# Patient Record
Sex: Male | Born: 2000 | Hispanic: No | Marital: Single | Smoking: Never smoker
Health system: Southern US, Community
[De-identification: ages and names within clinical notes are randomized; demographics above are authoritative.]

## PROBLEM LIST (undated history)

## (undated) DIAGNOSIS — S62309A Unspecified fracture of unspecified metacarpal bone, initial encounter for closed fracture: Secondary | ICD-10-CM

## (undated) DIAGNOSIS — R1310 Dysphagia, unspecified: Secondary | ICD-10-CM

## (undated) DIAGNOSIS — R198 Other specified symptoms and signs involving the digestive system and abdomen: Secondary | ICD-10-CM

---
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
## 2019-01-11 ENCOUNTER — Emergency Department (HOSPITAL_COMMUNITY)
Admission: EM | Admit: 2019-01-11 | Discharge: 2019-01-11 | Disposition: A | Discharge status: Home / Self Care | Payer: Medicaid Other | Attending: Emergency Medicine | Admitting: Emergency Medicine

## 2019-01-11 ENCOUNTER — Encounter (HOSPITAL_COMMUNITY): Payer: Self-pay

## 2019-01-11 ENCOUNTER — Other Ambulatory Visit: Payer: Self-pay

## 2019-01-11 DIAGNOSIS — N481 Balanitis: Secondary | ICD-10-CM | POA: Diagnosis not present

## 2019-01-11 DIAGNOSIS — N4889 Other specified disorders of penis: Secondary | ICD-10-CM | POA: Diagnosis present

## 2019-01-11 MED ORDER — FLUCONAZOLE 200 MG PO TABS
200.0000 mg | ORAL_TABLET | Freq: Once | ORAL | Status: AC
  Administered 2019-01-11: 14:00:00 200 mg via ORAL
  Filled 2019-01-11: qty 1

## 2019-01-11 MED ORDER — CEPHALEXIN 500 MG PO CAPS: 500.0000 mg | ORAL_CAPSULE | Freq: Four times a day (QID) | ORAL | 0 refills | Status: DC

## 2019-01-11 NOTE — ED Triage Notes (Addendum)
Patient c/o pain at tip of penis X1 week. Patient states he thought he had a yeast infection and got something from [facility] for yeast infection with no relief.   C/O pain and itching.  Denies any urinary symptoms and burning.   Reports having unprotected sex.    A/Ox4 Ambulatory in triage.

## 2019-01-11 NOTE — ED Provider Notes (Signed)
[facility]-EMERGENCY DEPT Provider Note   CSN: [id number] Arrival date & time: 01/11/19  1248    History   Chief Complaint Chief Complaint  Patient presents with  . Penis Pain    HPI Anthony Phelps is a 18 y.o. male.     Patient c/o irritation/itching/swelling to head of penis in past couple weeks. Patient uncircumcised, hx balanitis. Patient denies penile discharge. No dysuria or frequency. No history diabetes. No known std exposure. Tried otc cream without resolution. Denies fever or chills. Does not feel sick or ill. No scrotal or testicular pain or swelling.   The history is provided by the patient.  Penis Pain     Past Medical History:  Diagnosis Date  . Difficulty swallowing pills   . Metacarpal bone fracture 03/08/2014   right 4th/5th    Patient Active Problem List   Diagnosis Date Noted  . 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 OPEN REDUCTION INTERNAL FIXATION (ORIF) METACARPAL Right 03/16/2014   Procedure: CLOSED VS OPEN TREATMENT OF RIGHT 4TH AND 5TH METACARPAL FRACTURES;  Surgeon: Jodi Marble, MD;  Location: [facility];  Service: Orthopedics;  Laterality: Right;  Right 4th and 5th.        Home Medications    Prior to Admission medications   Medication Sig Start Date End Date Taking? Authorizing Provider  HYDROcodone-acetaminophen (NORCO) 5-325 MG per tablet Take 1 tablet by mouth every 6 (six) hours as needed for moderate pain or severe pain. 03/08/14   Mack Hook, MD  nystatin (MYCOSTATIN/NYSTOP) powder Apply topically 2 (two) times daily. 10/16/17   Caccavale, Sophia, PA-C    Family History Family History  Problem Relation Age of Onset  . Diabetes Maternal Grandmother   . Hypertension Maternal Grandmother   . Asthma Maternal Grandmother     Social History Social History   Tobacco Use  . Smoking status: Never Smoker  . Smokeless tobacco: Never Used   Substance Use Topics  . Alcohol use: No  . Drug use: No     Allergies   Patient has no known allergies.   Review of Systems Review of Systems  Constitutional: Negative for fever.  HENT: Negative for sore throat.   Eyes: Negative for redness.  Gastrointestinal: Negative for vomiting.  Endocrine: Negative for polydipsia and polyuria.  Genitourinary: Positive for penile pain. Negative for discharge, scrotal swelling and testicular pain.     Physical Exam Updated Vital Signs BP (!) 143/84 (BP Location: Left Arm)   Pulse 82   Temp 98.5 F (36.9 C) (Oral)   Resp 18   SpO2 100%   Physical Exam Vitals signs and nursing note reviewed.  Constitutional:      Appearance: Normal appearance. He is well-developed.  HENT:     Head: Atraumatic.     Nose: Nose normal.     Mouth/Throat:     Mouth: Mucous membranes are moist.     Pharynx: Oropharynx is clear.  Eyes:     General: No scleral icterus.    Conjunctiva/sclera: Conjunctivae normal.  Neck:     Musculoskeletal: Normal range of motion and neck supple.     Trachea: No tracheal deviation.  Cardiovascular:     Rate and Rhythm: Normal rate.  Pulmonary:     Effort: Pulmonary effort is normal. No accessory muscle usage or respiratory distress.  Abdominal:     General: There is no distension.  Palpations: Abdomen is soft.     Tenderness: There is no abdominal tenderness.  Genitourinary:    Comments: Uncircumcised penis. Pt able to easily retract and reduce foreskin. Excoriation/inflammation about head of penis, fold of foreskin, mild whitish exudate. No penile discharge. No vesicular lesions. No ulcers.  No scrotal or testicular pain, swelling, or tenderness. No ing l/a. Musculoskeletal:        General: No swelling.  Skin:    General: Skin is warm and dry.     Findings: No rash.  Neurological:     Mental Status: He is alert.     Comments: Alert, speech clear.   Psychiatric:        Mood and Affect: Mood normal.       ED Treatments / Results  Labs (all labs ordered are listed, but only abnormal results are displayed) Labs Reviewed - No data to display  EKG None  Radiology No results found.  Procedures Procedures (including critical care time)  Medications Ordered in ED Medications  fluconazole (DIFLUCAN) tablet 200 mg (has no administration in time range)     Initial Impression / Assessment and Plan / ED Course  I have reviewed the triage vital signs and the nursing notes.  Pertinent labs & imaging results that were available during my care of the patient were reviewed by me and considered in my medical decision making (see chart for details).  Symptoms appear c/w fungal balanitis, ?superimposed mild skin infxn.   Confirmed nkda.   Fluconazole po. rx keflex. Discussed keeping area very clean/dry.   rec pcp f/u 1 week.  Return precautions provided.     Final Clinical Impressions(s) / ED Diagnoses   Final diagnoses:  None    ED Discharge Orders    None       Cathren LaineSteinl, Jennamarie Goings, MD 01/11/19 1317

## 2019-01-11 NOTE — Discharge Instructions (Addendum)
It was our pleasure to provide your ER care today - we hope that you feel better.  Keep area very clean and dry.  Wash with warm water/soap 1-2x/day, and make sure to dry well.   Take keflex as prescribed.   You may apply a thin coat of clotrimazole cream and hydrocortisone cream to area as directed - these medications are available over the counter.   Follow up with primary care doctor in 1 week if symptoms fail to improve/resolve.  Return to ER if worse, new symptoms, severe pain, increased swelling/spreading redness, fevers, other concern.

## 2019-06-03 ENCOUNTER — Other Ambulatory Visit: Payer: Self-pay

## 2019-06-03 ENCOUNTER — Emergency Department (HOSPITAL_COMMUNITY)
Admission: EM | Admit: 2019-06-03 | Discharge: 2019-06-03 | Disposition: A | Discharge status: Home / Self Care | Payer: Medicaid Other | Attending: Emergency Medicine | Admitting: Emergency Medicine

## 2019-06-03 ENCOUNTER — Encounter (HOSPITAL_COMMUNITY): Payer: Self-pay | Admitting: Family Medicine

## 2019-06-03 DIAGNOSIS — J111 Influenza due to unidentified influenza virus with other respiratory manifestations: Secondary | ICD-10-CM | POA: Diagnosis not present

## 2019-06-03 DIAGNOSIS — R0981 Nasal congestion: Secondary | ICD-10-CM | POA: Insufficient documentation

## 2019-06-03 DIAGNOSIS — Z20828 Contact with and (suspected) exposure to other viral communicable diseases: Secondary | ICD-10-CM | POA: Diagnosis not present

## 2019-06-03 DIAGNOSIS — Z20822 Contact with and (suspected) exposure to covid-19: Secondary | ICD-10-CM

## 2019-06-03 DIAGNOSIS — R5381 Other malaise: Secondary | ICD-10-CM | POA: Diagnosis present

## 2019-06-03 DIAGNOSIS — R05 Cough: Secondary | ICD-10-CM | POA: Insufficient documentation

## 2019-06-03 LAB — SARS CORONAVIRUS 2 (TAT 6-24 HRS): SARS Coronavirus 2: NEGATIVE

## 2019-06-03 LAB — GROUP A STREP BY PCR: Group A Strep by PCR: NOT DETECTED

## 2019-06-03 MED ORDER — ACETAMINOPHEN 325 MG PO TABS
650.0000 mg | ORAL_TABLET | Freq: Once | ORAL | Status: AC | PRN
  Administered 2019-06-03: 650 mg via ORAL
  Filled 2019-06-03: qty 2

## 2019-06-03 NOTE — ED Notes (Signed)
EDP at bedside  

## 2019-06-03 NOTE — ED Notes (Signed)
Pt ambulatory from Waiting Room to Room 9 with steady gait, no SHOB.

## 2019-06-03 NOTE — Discharge Instructions (Signed)
You may take over-the-counter medicine for symptomatic relief, such as Tylenol, Motrin, TheraFlu, Alka seltzer , black elderberry, etc. Please limit acetaminophen (Tylenol) to 4000 mg and Ibuprofen (Motrin, Advil, etc.) to 2400 mg for a 24hr period. Please note that other over-the-counter medicine may contain acetaminophen or ibuprofen as a component of their ingredients.    For your constipation, you may take MiraLax for assistance.

## 2019-06-03 NOTE — ED Provider Notes (Signed)
Anthony Phelps, Anthony Phelps, Anthony Phelps  HPI Anthony Phelps is a 18 y.o. male who presents to the emergency department with 1 week of generalized malaise Anthony myalgias with intermittent Phelps, Anthony Phelps 1 day of fever.  Alleviated by Tylenol.  No chest pain or shortness of breath.  No nausea or vomiting.  No abdominal pain.  No urinary symptoms.  Patient denies any known sick contacts or COVID exposure.  Reports that he was tested Thursday with a saliva COVID tes which resulted negative yesterday.  HPI  Past Medical History Past Medical History:  Diagnosis Date   Difficulty swallowing pills    Metacarpal bone fracture 03/08/2014   right 4th/5th   Patient Active Problem List   Diagnosis Date Noted   Bone infection ([place]) 05/17/2014   Home Medication(s) Prior to Admission medications   Medication Sig Start Date End Date Taking? Authorizing Provider  cephALEXin (KEFLEX) 500 MG capsule Take 1 capsule (500 mg total) by mouth 4 (four) times daily. 01/11/19   Lajean Saver, MD  HYDROcodone-acetaminophen (NORCO) 5-325 MG per tablet Take 1 tablet by mouth every 6 (six) hours as needed for moderate pain or severe pain. 03/08/14   Milly Jakob, MD  nystatin (MYCOSTATIN/NYSTOP) powder Apply topically 2 (two) times daily. 10/16/17   Franchot Heidelberg, PA-C                                                                                                                                    Past Surgical History Past Surgical History:  Procedure Laterality Date   OPEN REDUCTION INTERNAL FIXATION (ORIF) METACARPAL Right 03/16/2014   Procedure: CLOSED VS OPEN TREATMENT OF RIGHT 4TH Anthony 5TH METACARPAL FRACTURES;  Surgeon: Jolyn Nap, MD;  Location: [facility];  Service: Orthopedics;  Laterality:  Right;  Right 4th Anthony 5th.   Family History Family History  Problem Relation Age of Onset   Diabetes Maternal Grandmother    Hypertension Maternal Grandmother    Asthma Maternal Grandmother     Social History Social History   Tobacco Use   Smoking status: Never Smoker   Smokeless tobacco: Never Used  Substance Use Topics   Alcohol use: No   Drug use: No   Allergies Patient has no known allergies.  Review of Systems Review of Systems All other systems are reviewed Anthony are negative for acute change except as noted in the HPI  Physical Exam Vital Signs  I have reviewed the triage vital signs BP (!) 127/58    Pulse 82    Temp 97.7 F (36.5 C) (Oral)    Resp 16    Ht 5' 5.5" (1.664 m)    Wt 81.6 kg    SpO2 100%    BMI 29.50 kg/m  Physical Exam Vitals signs reviewed.  Constitutional:      General: He is not in acute distress.    Appearance: He is well-developed. He is not diaphoretic.  HENT:     Head: Normocephalic Anthony atraumatic.     Nose: Nose normal.     Mouth/Phelps:     Pharynx: Oropharynx is clear.  Eyes:     General: No scleral icterus.       Right eye: No discharge.        Left eye: No discharge.     Conjunctiva/sclera: Conjunctivae normal.     Pupils: Pupils are equal, round, Anthony reactive to light.  Neck:     Musculoskeletal: Normal range of motion Anthony neck supple.  Cardiovascular:     Rate Anthony Rhythm: Normal rate Anthony regular rhythm.     Heart sounds: No murmur. No friction rub. No gallop.   Pulmonary:     Effort: Pulmonary effort is normal. No respiratory distress.     Breath sounds: Normal breath sounds. No stridor. No wheezing, rhonchi or rales.  Abdominal:     General: There is no distension.     Palpations: Abdomen is soft.     Tenderness: There is no abdominal tenderness.  Musculoskeletal:        General: No tenderness.  Skin:    General: Skin is warm Anthony dry.     Findings: No erythema or rash.  Neurological:     Mental Status: He  is alert Anthony oriented to person, place, Anthony time.     ED Results Anthony Treatments Labs (all labs ordered are listed, but only abnormal results are displayed) Labs Reviewed  GROUP A STREP BY PCR  SARS CORONAVIRUS 2 (TAT 6-24 HRS)                                                                                                                         EKG  EKG Interpretation  Date/Time:    Ventricular Rate:    PR Interval:    QRS Duration:   QT Interval:    QTC Calculation:   R Axis:     Text Interpretation:        Radiology No results found.  Pertinent labs & imaging results that were available during my care of the patient were reviewed by me Anthony considered in my medical decision making (see chart for details).  Medications Ordered in ED Medications  acetaminophen (TYLENOL) tablet 650 mg (650 mg Oral Given 06/03/19 0204)  Procedures Procedures  (including critical care time)  Medical Decision Making / ED Course I have reviewed the nursing notes for this encounter Anthony the patient's prior records (if available in EHR or on provided paperwork).   HEMI CHACKO was evaluated in Emergency Department on 06/03/2019 for the symptoms described in the history of present illness. He was evaluated in the context of the global COVID-19 pandemic, which necessitated consideration that the patient might be at risk for infection with the SARS-CoV-2 virus that causes COVID-19. Institutional protocols Anthony algorithms that pertain to the evaluation of patients at risk for COVID-19 are in a state of rapid change based on information released by regulatory bodies including the CDC Anthony federal Anthony state organizations. These policies Anthony algorithms were followed during the patient's care in the ED.  Patient presents with viral symptoms for 1 week. adequate oral hydration. Rest  of history as above.  Patient appears well. No signs of toxicity, patient is interactive. No hypoxia, tachypnea or other signs of respiratory distress. No sign of clinical dehydration. Lung exam clear. Rest of exam as above.  Most consistent with viral illness   No evidence suggestive of pharyngitis, AOM, PNA, or meningitis.  Chest x-ray not indicated at this time.  Patient evaluated, tested Anthony sent home with instructions for home care Anthony Quarantine. Instructed to seek further care if symptoms worsen.  Is set up with follow up for a virtual visit with PCP in next 24-48 hours.  Discussed symptomatic treatment with the patient Anthony they will follow closely with their PCP.        Final Clinical Impression(s) / ED Diagnoses Final diagnoses:  Influenza-like illness  Suspected COVID-19 virus infection    The patient appears reasonably screened Anthony/or stabilized for discharge Anthony I doubt any other medical condition or other [facility] requiring further screening, evaluation, or treatment in the ED at this time prior to discharge.  Disposition: Discharge  Condition: Good  I have discussed the results, Dx Anthony Tx plan with the patient who expressed understanding Anthony agree(s) with the plan. Discharge instructions discussed at great length. The patient was given strict return precautions who verbalized understanding of the instructions. No further questions at time of discharge.    ED Discharge Orders    None       Follow Up: Primary care provider  Schedule an appointment as soon as possible for a visit  If you do not have a primary care physician, contact [facility] at [phone] for referral     This chart was dictated using voice recognition software.  Despite best efforts to proofread,  errors can occur which can change the documentation meaning.   Nira Conn, MD 06/03/19 [phone]

## 2019-06-03 NOTE — ED Triage Notes (Signed)
Patient states he is experiencing body aches, non productive cough, sore throat for the last two weeks. Patient states he was tested for COVID four days ago and informed yesterday he was negative for symptoms.

## 2019-06-04 ENCOUNTER — Encounter (HOSPITAL_COMMUNITY): Payer: Self-pay

## 2019-06-04 ENCOUNTER — Emergency Department (HOSPITAL_COMMUNITY)
Admission: EM | Admit: 2019-06-04 | Discharge: 2019-06-04 | Disposition: A | Discharge status: Home / Self Care | Payer: Medicaid Other | Attending: Emergency Medicine | Admitting: Emergency Medicine

## 2019-06-04 DIAGNOSIS — L509 Urticaria, unspecified: Secondary | ICD-10-CM | POA: Diagnosis not present

## 2019-06-04 DIAGNOSIS — R21 Rash and other nonspecific skin eruption: Secondary | ICD-10-CM | POA: Diagnosis present

## 2019-06-04 MED ORDER — FAMOTIDINE 40 MG/5ML PO SUSR
20.0000 mg | Freq: Once | ORAL | Status: AC
  Administered 2019-06-04: 20 mg via ORAL
  Filled 2019-06-04: qty 2.5

## 2019-06-04 MED ORDER — DIPHENHYDRAMINE HCL 12.5 MG/5ML PO ELIX
50.0000 mg | ORAL_SOLUTION | Freq: Once | ORAL | Status: AC
  Administered 2019-06-04: 04:00:00 50 mg via ORAL
  Filled 2019-06-04: qty 20

## 2019-06-04 MED ORDER — DEXAMETHASONE SODIUM PHOSPHATE 10 MG/ML IJ SOLN
10.0000 mg | Freq: Once | INTRAMUSCULAR | Status: AC
  Administered 2019-06-04: 10 mg via INTRAMUSCULAR
  Filled 2019-06-04: qty 1

## 2019-06-04 NOTE — ED Provider Notes (Signed)
[facility] DEPT Provider Note   CSN: [id number] Arrival date & time: 06/04/19  0129     History   Chief Complaint No chief complaint on file.   HPI Anthony Phelps is a 18 y.o. male.     Patient presents to the emergency department with rash.  Patient reports that he broke out with an itchy rash on his right arm 3 or 4 hours ago.  The rash on the arm is fading but he now has rash on both of his legs, left more than right.  Patient reports that he has not been outside because he is currently quarantining for a febrile illness.  He has not been on any new medications, other than Tylenol.  No history of allergy to medications.  He has not used any new skin products.  He is not experiencing any tongue swelling, throat swelling, difficulty breathing.  No nausea or abdominal pain.     Past Medical History:  Diagnosis Date  . Difficulty swallowing pills   . Metacarpal bone fracture 03/08/2014   right 4th/5th    Patient Active Problem List   Diagnosis Date Noted  . Bone infection ([place]) 05/17/2014    Past Surgical History:  Procedure Laterality Date  . OPEN REDUCTION INTERNAL FIXATION (ORIF) METACARPAL Right 03/16/2014   Procedure: CLOSED VS OPEN TREATMENT OF RIGHT 4TH AND 5TH METACARPAL FRACTURES;  Surgeon: Jolyn Nap, MD;  Location: [facility];  Service: Orthopedics;  Laterality: Right;  Right 4th and 5th.        Home Medications    Prior to Admission medications   Medication Sig Start Date End Date Taking? Authorizing Provider  cephALEXin (KEFLEX) 500 MG capsule Take 1 capsule (500 mg total) by mouth 4 (four) times daily. 01/11/19   Lajean Saver, MD  HYDROcodone-acetaminophen (NORCO) 5-325 MG per tablet Take 1 tablet by mouth every 6 (six) hours as needed for moderate pain or severe pain. 03/08/14   Milly Jakob, MD  nystatin (MYCOSTATIN/NYSTOP) powder Apply topically 2 (two) times daily. 10/16/17   Caccavale, Sophia, PA-C    Family History Family History  Problem Relation Age of Onset  . Diabetes Maternal Grandmother   . Hypertension Maternal Grandmother   . Asthma Maternal Grandmother     Social History Social History   Tobacco Use  . Smoking status: Never Smoker  . Smokeless tobacco: Never Used  Substance Use Topics  . Alcohol use: No  . Drug use: No     Allergies   Patient has no known allergies.   Review of Systems Review of Systems  Skin: Positive for rash.  All other systems reviewed and are negative.    Physical Exam Updated Vital Signs BP 129/78 (BP Location: Right Arm)   Pulse 84   Temp 98.7 F (37.1 C) (Oral)   Resp 20   SpO2 99%   Physical Exam Vitals signs and nursing note reviewed.  Constitutional:      General: He is not in acute distress.    Appearance: Normal appearance. He is well-developed.  HENT:     Head: Normocephalic and atraumatic.     Right Ear: Hearing normal.     Left Ear: Hearing normal.     Nose: Nose normal.  Eyes:     Conjunctiva/sclera: Conjunctivae normal.     Pupils: Pupils are equal, round, and reactive to light.  Neck:     Musculoskeletal: Normal range of motion and neck supple.  Cardiovascular:  Rate and Rhythm: Regular rhythm.     Heart sounds: S1 normal and S2 normal. No murmur. No friction rub. No gallop.   Pulmonary:     Effort: Pulmonary effort is normal. No respiratory distress.     Breath sounds: Normal breath sounds.  Chest:     Chest wall: No tenderness.  Abdominal:     General: Bowel sounds are normal.     Palpations: Abdomen is soft.     Tenderness: There is no abdominal tenderness. There is no guarding or rebound. Negative signs include Murphy's sign and McBurney's sign.     Hernia: No hernia is present.  Musculoskeletal: Normal range of motion.  Skin:    General: Skin is warm and dry.     Findings: Rash (urticaria on bilateral lower legs) present.  Neurological:     Mental Status: He is alert and oriented to  person, place, and time.     GCS: GCS eye subscore is 4. GCS verbal subscore is 5. GCS motor subscore is 6.     Cranial Nerves: No cranial nerve deficit.     Sensory: No sensory deficit.     Coordination: Coordination normal.  Psychiatric:        Speech: Speech normal.        Behavior: Behavior normal.        Thought Content: Thought content normal.      ED Treatments / Results  Labs (all labs ordered are listed, but only abnormal results are displayed) Labs Reviewed - No data to display  EKG None  Radiology No results found.  Procedures Procedures (including critical care time)  Medications Ordered in ED Medications  dexamethasone (DECADRON) injection 10 mg (has no administration in time range)  diphenhydrAMINE (BENADRYL) 12.5 MG/5ML elixir 50 mg (50 mg Oral Given 06/04/19 0417)  famotidine (PEPCID) 40 MG/5ML suspension 20 mg (20 mg Oral Given 06/04/19 0416)     Initial Impression / Assessment and Plan / ED Course  I have reviewed the triage vital signs and the nursing notes.  Pertinent labs & imaging results that were available during my care of the patient were reviewed by me and considered in my medical decision making (see chart for details).        Patient presents to the emergency department for evaluation of urticaria.  Patient broke out with hives on his arm and then started to have hives on both legs, came to the ER for evaluation.  He has not taken any medication prior to arrival.  He is not in any distress, no signs of anaphylaxis.  Vital signs are all normal.  Because of the hives is unclear.  He has been ill for approximately a week with febrile illness.  COVID-19 test performed yesterday has resulted and is negative.  Patient treated with Benadryl and Pepcid, monitored.  His hives have now resolved.  We will give IM injection of Decadron and have him continue p.o. Benadryl as needed as an outpatient. Final Clinical Impressions(s) / ED Diagnoses   Final  diagnoses:  Hives    ED Discharge Orders    None       Gilda Crease, MD 06/04/19 [phone]

## 2019-06-04 NOTE — Discharge Instructions (Signed)
Take Benadryl (over-the-counter medication) 1 tablet every 6 hours for the next 2 days.

## 2019-06-04 NOTE — ED Triage Notes (Signed)
Pt was here last night for the same and wasn't seen, he complains of body aches, fever, and a cough for two weeks, has tested negative for COVID Pt states that tonight he developed a generalized rash

## 2020-02-25 DIAGNOSIS — Z419 Encounter for procedure for purposes other than remedying health state, unspecified: Secondary | ICD-10-CM | POA: Diagnosis not present

## 2020-03-27 DIAGNOSIS — Z419 Encounter for procedure for purposes other than remedying health state, unspecified: Secondary | ICD-10-CM | POA: Diagnosis not present

## 2020-04-27 DIAGNOSIS — Z419 Encounter for procedure for purposes other than remedying health state, unspecified: Secondary | ICD-10-CM | POA: Diagnosis not present

## 2020-05-27 DIAGNOSIS — Z419 Encounter for procedure for purposes other than remedying health state, unspecified: Secondary | ICD-10-CM | POA: Diagnosis not present

## 2020-06-15 DIAGNOSIS — Z0000 Encounter for general adult medical examination without abnormal findings: Secondary | ICD-10-CM | POA: Diagnosis not present

## 2020-06-15 DIAGNOSIS — F419 Anxiety disorder, unspecified: Secondary | ICD-10-CM | POA: Diagnosis not present

## 2020-06-27 DIAGNOSIS — Z419 Encounter for procedure for purposes other than remedying health state, unspecified: Secondary | ICD-10-CM | POA: Diagnosis not present

## 2020-07-27 DIAGNOSIS — Z419 Encounter for procedure for purposes other than remedying health state, unspecified: Secondary | ICD-10-CM | POA: Diagnosis not present

## 2020-08-27 DIAGNOSIS — Z419 Encounter for procedure for purposes other than remedying health state, unspecified: Secondary | ICD-10-CM | POA: Diagnosis not present

## 2020-09-27 DIAGNOSIS — Z419 Encounter for procedure for purposes other than remedying health state, unspecified: Secondary | ICD-10-CM | POA: Diagnosis not present

## 2020-10-25 DIAGNOSIS — Z419 Encounter for procedure for purposes other than remedying health state, unspecified: Secondary | ICD-10-CM | POA: Diagnosis not present

## 2020-11-25 DIAGNOSIS — Z419 Encounter for procedure for purposes other than remedying health state, unspecified: Secondary | ICD-10-CM | POA: Diagnosis not present

## 2020-12-25 DIAGNOSIS — Z419 Encounter for procedure for purposes other than remedying health state, unspecified: Secondary | ICD-10-CM | POA: Diagnosis not present

## 2021-01-01 ENCOUNTER — Other Ambulatory Visit: Payer: Self-pay

## 2021-01-01 ENCOUNTER — Emergency Department (HOSPITAL_COMMUNITY)
Admission: EM | Admit: 2021-01-01 | Discharge: 2021-01-01 | Disposition: A | Discharge status: Home / Self Care | Payer: Managed Care, Other (non HMO) | Attending: Emergency Medicine | Admitting: Emergency Medicine

## 2021-01-01 DIAGNOSIS — R300 Dysuria: Secondary | ICD-10-CM | POA: Insufficient documentation

## 2021-01-01 DIAGNOSIS — R110 Nausea: Secondary | ICD-10-CM | POA: Insufficient documentation

## 2021-01-01 DIAGNOSIS — B349 Viral infection, unspecified: Secondary | ICD-10-CM | POA: Diagnosis not present

## 2021-01-01 LAB — URINALYSIS, ROUTINE W REFLEX MICROSCOPIC
Bilirubin Urine: NEGATIVE
Glucose, UA: NEGATIVE mg/dL
Hgb urine dipstick: NEGATIVE
Ketones, ur: NEGATIVE mg/dL
Leukocytes,Ua: NEGATIVE
Nitrite: NEGATIVE
Protein, ur: NEGATIVE mg/dL
Specific Gravity, Urine: 1.004 — ABNORMAL LOW (ref 1.005–1.030)
pH: 6 (ref 5.0–8.0)

## 2021-01-01 LAB — COMPREHENSIVE METABOLIC PANEL
ALT: 21 U/L (ref 0–44)
AST: 22 U/L (ref 15–41)
Albumin: 4.4 g/dL (ref 3.5–5.0)
Alkaline Phosphatase: 65 U/L (ref 38–126)
Anion gap: 4 — ABNORMAL LOW (ref 5–15)
BUN: 13 mg/dL (ref 6–20)
CO2: 29 mmol/L (ref 22–32)
Calcium: 9.3 mg/dL (ref 8.9–10.3)
Chloride: 105 mmol/L (ref 98–111)
Creatinine, Ser: 0.98 mg/dL (ref 0.61–1.24)
GFR, Estimated: >60 mL/min (ref >60)
Glucose, Bld: 94 mg/dL (ref 70–99)
Potassium: 4.3 mmol/L (ref 3.5–5.1)
Sodium: 138 mmol/L (ref 135–145)
Total Bilirubin: 0.5 mg/dL (ref 0.3–1.2)
Total Protein: 7.8 g/dL (ref 6.5–8.1)

## 2021-01-01 LAB — CBC
HCT: 44.2 % (ref 39.0–52.0)
Hemoglobin: 15.8 g/dL (ref 13.0–17.0)
MCH: 31.6 pg (ref 26.0–34.0)
MCHC: 35.7 g/dL (ref 30.0–36.0)
MCV: 88.4 fL (ref 80.0–100.0)
Platelets: 176 10*3/uL (ref 150–400)
RBC: 5 MIL/uL (ref 4.22–5.81)
RDW: 11.8 % (ref 11.5–15.5)
WBC: 7.6 10*3/uL (ref 4.0–10.5)
nRBC: 0 % (ref 0.0–0.2)

## 2021-01-01 LAB — CBG MONITORING, ED: Glucose-Capillary: 84 mg/dL (ref 70–99)

## 2021-01-01 LAB — LIPASE, BLOOD: Lipase: 40 U/L (ref 11–51)

## 2021-01-01 MED ORDER — SODIUM CHLORIDE 0.9 % IV BOLUS
1000.0000 mL | Freq: Once | INTRAVENOUS | Status: AC
  Administered 2021-01-01: 1000 mL via INTRAVENOUS

## 2021-01-01 MED ORDER — ONDANSETRON HCL 4 MG/2ML IJ SOLN
4.0000 mg | Freq: Once | INTRAMUSCULAR | Status: AC
  Administered 2021-01-01: 4 mg via INTRAVENOUS
  Filled 2021-01-01: qty 2

## 2021-01-01 NOTE — ED Triage Notes (Signed)
Patient reports nausea/diarrhea/dizziness since Friday. Says he feels like he could pass out at any moment. endorses abd pain 4/10.

## 2021-01-01 NOTE — ED Provider Notes (Signed)
[facility]-EMERGENCY DEPT Provider Note   CSN: [id number] Arrival date & time: 01/01/21  1032     History Chief Complaint  Patient presents with  . Nausea  . Dysuria    Anthony Phelps is a 20 y.o. male presents to ED with 2-day history of nausea, slight diarrhea, lightheadedness, dysuria and urinary frequency.  He cannot recall any incident that may have triggered the symptoms.  No sick contacts with similar symptoms.  Has tried Dramamine and DayQuil with only minimal improvement in symptoms.  Reports normal appetite level, denies any vomiting.  No alcohol or drug use.  No prior abdominal surgeries.  Denies any chest pain, shortness of breath, fever, back pain, injuries or falls.  No vision changes.  HPI     Past Medical History:  Diagnosis Date  . Difficulty swallowing pills   . Metacarpal bone fracture 03/08/2014   right 4th/5th    Patient Active Problem List   Diagnosis Date Noted  . 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 OPEN REDUCTION INTERNAL FIXATION (ORIF) METACARPAL Right 03/16/2014   Procedure: CLOSED VS OPEN TREATMENT OF RIGHT 4TH AND 5TH METACARPAL FRACTURES;  Surgeon: Jodi Marble, MD;  Location: [facility];  Service: Orthopedics;  Laterality: Right;  Right 4th and 5th.       Family History  Problem Relation Age of Onset  . Diabetes Maternal Grandmother   . Hypertension Maternal Grandmother   . Asthma Maternal Grandmother     Social History   Tobacco Use  . Smoking status: Never Smoker  . Smokeless tobacco: Never Used  Substance Use Topics  . Alcohol use: No  . Drug use: No    Home Medications Prior to Admission medications   Medication Sig Start Date End Date Taking? Authorizing Provider  cephALEXin (KEFLEX) 500 MG capsule Take 1 capsule (500 mg total) by mouth 4 (four) times daily. 01/11/19   Cathren Laine, MD  HYDROcodone-acetaminophen (NORCO) 5-325 MG per  tablet Take 1 tablet by mouth every 6 (six) hours as needed for moderate pain or severe pain. 03/08/14   Mack Hook, MD  nystatin (MYCOSTATIN/NYSTOP) powder Apply topically 2 (two) times daily. 10/16/17   Caccavale, Sophia, PA-C    Allergies    Patient has no known allergies.  Review of Systems   Review of Systems  Constitutional: Positive for fatigue. Negative for appetite change, chills and fever.  HENT: Negative for ear pain, rhinorrhea, sneezing and sore throat.   Eyes: Negative for photophobia and visual disturbance.  Respiratory: Negative for cough, chest tightness, shortness of breath and wheezing.   Cardiovascular: Negative for chest pain and palpitations.  Gastrointestinal: Positive for diarrhea and nausea. Negative for abdominal pain, blood in stool, constipation and vomiting.  Genitourinary: Positive for dysuria and frequency. Negative for hematuria and urgency.  Musculoskeletal: Negative for myalgias.  Skin: Negative for rash.  Neurological: Positive for light-headedness. Negative for dizziness and weakness.    Physical Exam Updated Vital Signs BP 112/67   Pulse (!) 51   Temp 98.2 F (36.8 C) (Oral)   Resp 16   Ht 5\' 5"  (1.651 m)   Wt 74.8 kg   SpO2 100%   BMI 27.46 kg/m   Physical Exam Vitals and nursing note reviewed.  Constitutional:      General: He is not in acute distress.    Appearance: He is well-developed.  HENT:     Head: Normocephalic and  atraumatic.     Nose: Nose normal.  Eyes:     General: No scleral icterus.       Right eye: No discharge.        Left eye: No discharge.     Conjunctiva/sclera: Conjunctivae normal.     Pupils: Pupils are equal, round, and reactive to light.  Cardiovascular:     Rate and Rhythm: Normal rate and regular rhythm.     Heart sounds: Normal heart sounds. No murmur heard. No friction rub. No gallop.   Pulmonary:     Effort: Pulmonary effort is normal. No respiratory distress.     Breath sounds: Normal breath  sounds.  Abdominal:     General: Bowel sounds are normal. There is no distension.     Palpations: Abdomen is soft.     Tenderness: There is no abdominal tenderness. There is no guarding.  Musculoskeletal:        General: Normal range of motion.     Cervical back: Normal range of motion and neck supple.  Skin:    General: Skin is warm and dry.     Findings: No rash.  Neurological:     Mental Status: He is alert and oriented to person, place, and time.     Cranial Nerves: No cranial nerve deficit.     Sensory: No sensory deficit.     Motor: No weakness or abnormal muscle tone.     Coordination: Coordination normal.     ED Results / Procedures / Treatments   Labs (all labs ordered are listed, but only abnormal results are displayed) Labs Reviewed  COMPREHENSIVE METABOLIC PANEL - Abnormal; Notable for the following components:      Result Value   Anion gap 4 (*)    All other components within normal limits  URINALYSIS, ROUTINE W REFLEX MICROSCOPIC - Abnormal; Notable for the following components:   Color, Urine COLORLESS (*)    Specific Gravity, Urine 1.004 (*)    All other components within normal limits  URINE CULTURE  LIPASE, BLOOD  CBC  CBG MONITORING, ED    EKG EKG Interpretation  Date/Time:  Sunday Jan 01 2021 11:01:41 EDT Ventricular Rate:  66 PR Interval:  164 QRS Duration: 80 QT Interval:  361 QTC Calculation: 379 R Axis:   96 Text Interpretation: Sinus rhythm Borderline right axis deviation No previous tracing Confirmed by Knapp, Jon ([id number]) on 01/01/2021 11:12:01 AM   Radiology No results found.  Procedures Procedures   Medications Ordered in ED Medications  sodium chloride 0.9 % bolus 1,000 mL (0 mLs Intravenous Stopped 01/01/21 1210)  ondansetron (ZOFRAN) injection 4 mg (4 mg Intravenous Given 01/01/21 1112)    ED Course  I have reviewed the triage vital signs and the nursing notes.  Pertinent labs & imaging results that were available during my  care of the patient were reviewed by me and considered in my medical decision making (see chart for details).  Clinical Course as of 01/01/21 1305  Sun Jan 01, 2021  1122 Glucose-Capillary: 84 [HK]  1141 WBC: 7.6 [HK]  1141 Hemoglobin: 15.8 [HK]  1213 Creatinine: 0.98 [HK]  1213 Lipase: 40 [HK]  1234 Specific Gravity, Urine(!): 1.004 [HK]    Clinical Course User Index [HK] Magaline Steinberg, PA-C   MDM Rules/Calculators/A&P                          20  year old male presenting to the ED for 2-day history of  nausea, dysuria, urinary frequency, diarrhea and lightheadedness.  Abdomen is soft here.  No neurological deficits.  No CVA tenderness noted bilaterally.  Vital signs within normal limits.  Will obtain lab work, attempt to IV hydrate and give Zofran and reassess.  EKG shows sinus rhythm, no arrhythmias or ischemic changes.  Normal CBG.  CBC, CMP unremarkable.  Normal lipase.  Urinalysis without evidence of infection.  Relayed these results to the patient.  Asked if he would like to be tested or is concern for STDs.  He declines.  I suspect his symptoms could be viral in nature.  He is tolerating p.o. intake without difficulty here and he feels much improved.  He is requesting discharge home with a work note.  I doubt his symptoms are due to acute neurological cause such as stroke, or any intra-abdominal surgical cause such as cholecystitis or appendicitis.  Patient is agreeable to the plan.  Return precautions given.   Patient is hemodynamically stable, in NAD, and able to ambulate in the ED. Evaluation does not show pathology that would require ongoing emergent intervention or inpatient treatment. I explained the diagnosis to the patient. Pain has been managed and has no complaints prior to discharge. Patient is comfortable with above plan and is stable for discharge at this time. All questions were answered prior to disposition. Strict return precautions for returning to the ED were discussed.  Encouraged follow up with PCP.   An After Visit Summary was printed and given to the patient.   Portions of this note were generated with Scientist, clinical (histocompatibility and immunogenetics). Dictation errors may occur despite best attempts at proofreading.  Final Clinical Impression(s) / ED Diagnoses Final diagnoses:  Viral syndrome  Nausea    Rx / DC Orders ED Discharge Orders    None       Dietrich Pates, PA-C 01/01/21 1305    Linwood Dibbles, MD 01/01/21 1654

## 2021-01-01 NOTE — ED Notes (Signed)
Patient given water at this time for po challenge, then will ambulate patient

## 2021-01-01 NOTE — Discharge Instructions (Addendum)
Return to the ER if you start to experience worsening lightheadedness, uncontrollable vomiting, chest pain, shortness of breath, numbness in arms or legs or loss of consciousness

## 2021-01-02 LAB — URINE CULTURE: Culture: NO GROWTH

## 2021-01-25 DIAGNOSIS — Z419 Encounter for procedure for purposes other than remedying health state, unspecified: Secondary | ICD-10-CM | POA: Diagnosis not present

## 2021-02-24 DIAGNOSIS — Z419 Encounter for procedure for purposes other than remedying health state, unspecified: Secondary | ICD-10-CM | POA: Diagnosis not present

## 2021-03-27 DIAGNOSIS — Z419 Encounter for procedure for purposes other than remedying health state, unspecified: Secondary | ICD-10-CM | POA: Diagnosis not present

## 2021-04-27 DIAGNOSIS — Z419 Encounter for procedure for purposes other than remedying health state, unspecified: Secondary | ICD-10-CM | POA: Diagnosis not present

## 2021-05-27 DIAGNOSIS — Z419 Encounter for procedure for purposes other than remedying health state, unspecified: Secondary | ICD-10-CM | POA: Diagnosis not present

## 2021-06-27 DIAGNOSIS — Z419 Encounter for procedure for purposes other than remedying health state, unspecified: Secondary | ICD-10-CM | POA: Diagnosis not present

## 2021-07-20 DIAGNOSIS — M79672 Pain in left foot: Secondary | ICD-10-CM | POA: Diagnosis not present

## 2021-07-27 DIAGNOSIS — Z419 Encounter for procedure for purposes other than remedying health state, unspecified: Secondary | ICD-10-CM | POA: Diagnosis not present

## 2021-08-27 DIAGNOSIS — Z419 Encounter for procedure for purposes other than remedying health state, unspecified: Secondary | ICD-10-CM | POA: Diagnosis not present

## 2021-09-27 DIAGNOSIS — Z419 Encounter for procedure for purposes other than remedying health state, unspecified: Secondary | ICD-10-CM | POA: Diagnosis not present

## 2021-10-25 DIAGNOSIS — Z419 Encounter for procedure for purposes other than remedying health state, unspecified: Secondary | ICD-10-CM | POA: Diagnosis not present

## 2021-11-25 DIAGNOSIS — Z419 Encounter for procedure for purposes other than remedying health state, unspecified: Secondary | ICD-10-CM | POA: Diagnosis not present

## 2021-12-25 DIAGNOSIS — Z419 Encounter for procedure for purposes other than remedying health state, unspecified: Secondary | ICD-10-CM | POA: Diagnosis not present

## 2022-01-25 DIAGNOSIS — Z419 Encounter for procedure for purposes other than remedying health state, unspecified: Secondary | ICD-10-CM | POA: Diagnosis not present

## 2022-02-24 DIAGNOSIS — Z419 Encounter for procedure for purposes other than remedying health state, unspecified: Secondary | ICD-10-CM | POA: Diagnosis not present

## 2022-03-27 DIAGNOSIS — Z419 Encounter for procedure for purposes other than remedying health state, unspecified: Secondary | ICD-10-CM | POA: Diagnosis not present

## 2022-04-27 DIAGNOSIS — Z419 Encounter for procedure for purposes other than remedying health state, unspecified: Secondary | ICD-10-CM | POA: Diagnosis not present

## 2022-05-27 DIAGNOSIS — Z419 Encounter for procedure for purposes other than remedying health state, unspecified: Secondary | ICD-10-CM | POA: Diagnosis not present

## 2022-06-27 DIAGNOSIS — Z419 Encounter for procedure for purposes other than remedying health state, unspecified: Secondary | ICD-10-CM | POA: Diagnosis not present

## 2022-07-27 DIAGNOSIS — Z419 Encounter for procedure for purposes other than remedying health state, unspecified: Secondary | ICD-10-CM | POA: Diagnosis not present

## 2022-09-27 DIAGNOSIS — Z419 Encounter for procedure for purposes other than remedying health state, unspecified: Secondary | ICD-10-CM | POA: Diagnosis not present

## 2022-10-26 DIAGNOSIS — Z419 Encounter for procedure for purposes other than remedying health state, unspecified: Secondary | ICD-10-CM | POA: Diagnosis not present

## 2022-11-26 DIAGNOSIS — Z419 Encounter for procedure for purposes other than remedying health state, unspecified: Secondary | ICD-10-CM | POA: Diagnosis not present

## 2022-12-26 DIAGNOSIS — Z419 Encounter for procedure for purposes other than remedying health state, unspecified: Secondary | ICD-10-CM | POA: Diagnosis not present

## 2023-01-26 DIAGNOSIS — Z419 Encounter for procedure for purposes other than remedying health state, unspecified: Secondary | ICD-10-CM | POA: Diagnosis not present

## 2023-02-25 DIAGNOSIS — Z419 Encounter for procedure for purposes other than remedying health state, unspecified: Secondary | ICD-10-CM | POA: Diagnosis not present

## 2023-03-28 DIAGNOSIS — Z419 Encounter for procedure for purposes other than remedying health state, unspecified: Secondary | ICD-10-CM | POA: Diagnosis not present

## 2023-04-28 DIAGNOSIS — Z419 Encounter for procedure for purposes other than remedying health state, unspecified: Secondary | ICD-10-CM | POA: Diagnosis not present

## 2023-05-19 DIAGNOSIS — J069 Acute upper respiratory infection, unspecified: Secondary | ICD-10-CM | POA: Diagnosis not present

## 2023-05-23 DIAGNOSIS — J069 Acute upper respiratory infection, unspecified: Secondary | ICD-10-CM | POA: Diagnosis not present

## 2023-05-23 DIAGNOSIS — Z20822 Contact with and (suspected) exposure to covid-19: Secondary | ICD-10-CM | POA: Diagnosis not present

## 2023-05-28 DIAGNOSIS — Z419 Encounter for procedure for purposes other than remedying health state, unspecified: Secondary | ICD-10-CM | POA: Diagnosis not present

## 2023-06-21 ENCOUNTER — Emergency Department (HOSPITAL_COMMUNITY)
Admission: EM | Admit: 2023-06-21 | Discharge: 2023-06-21 | Disposition: A | Discharge status: Home / Self Care | Payer: Managed Care, Other (non HMO) | Attending: Emergency Medicine | Admitting: Emergency Medicine

## 2023-06-21 ENCOUNTER — Encounter (HOSPITAL_COMMUNITY): Payer: Self-pay

## 2023-06-21 ENCOUNTER — Emergency Department (HOSPITAL_COMMUNITY): Payer: Managed Care, Other (non HMO)

## 2023-06-21 ENCOUNTER — Other Ambulatory Visit: Payer: Self-pay

## 2023-06-21 DIAGNOSIS — W231XXA Caught, crushed, jammed, or pinched between stationary objects, initial encounter: Secondary | ICD-10-CM | POA: Insufficient documentation

## 2023-06-21 DIAGNOSIS — S62637B Displaced fracture of distal phalanx of left little finger, initial encounter for open fracture: Secondary | ICD-10-CM | POA: Insufficient documentation

## 2023-06-21 DIAGNOSIS — S62639B Displaced fracture of distal phalanx of unspecified finger, initial encounter for open fracture: Secondary | ICD-10-CM

## 2023-06-21 DIAGNOSIS — Z23 Encounter for immunization: Secondary | ICD-10-CM | POA: Diagnosis not present

## 2023-06-21 DIAGNOSIS — S6992XA Unspecified injury of left wrist, hand and finger(s), initial encounter: Secondary | ICD-10-CM | POA: Diagnosis present

## 2023-06-21 DIAGNOSIS — S6710XA Crushing injury of unspecified finger(s), initial encounter: Secondary | ICD-10-CM

## 2023-06-21 DIAGNOSIS — S61319A Laceration without foreign body of unspecified finger with damage to nail, initial encounter: Secondary | ICD-10-CM

## 2023-06-21 MED ORDER — LIDOCAINE HCL (PF) 1 % IJ SOLN
10.0000 mL | Freq: Once | INTRAMUSCULAR | Status: AC
  Administered 2023-06-21: 10 mL
  Filled 2023-06-21: qty 30

## 2023-06-21 MED ORDER — CEPHALEXIN 500 MG PO CAPS: 500.0000 mg | ORAL_CAPSULE | Freq: Two times a day (BID) | ORAL | 0 refills | Status: AC

## 2023-06-21 MED ORDER — TETANUS-DIPHTH-ACELL PERTUSSIS 5-2.5-18.5 LF-MCG/0.5 IM SUSY
0.5000 mL | PREFILLED_SYRINGE | Freq: Once | INTRAMUSCULAR | Status: AC
  Administered 2023-06-21: 0.5 mL via INTRAMUSCULAR
  Filled 2023-06-21: qty 0.5

## 2023-06-21 MED ORDER — OXYCODONE-ACETAMINOPHEN 5-325 MG PO TABS: 1.0000 | ORAL_TABLET | Freq: Four times a day (QID) | ORAL | 0 refills | Status: AC | PRN

## 2023-06-21 MED ORDER — OXYCODONE-ACETAMINOPHEN 5-325 MG PO TABS
1.0000 | ORAL_TABLET | Freq: Once | ORAL | Status: AC
  Administered 2023-06-21: 1 via ORAL
  Filled 2023-06-21: qty 1

## 2023-06-21 NOTE — ED Triage Notes (Signed)
Patient reports he got his left 5th finger caught between a dryer and the wall while moving the dryer. Patient reports bleeding was controlled until this AM when bleeding started again. Bleeding controlled at time of triage.

## 2023-06-21 NOTE — ED Provider Notes (Signed)
[place] EMERGENCY DEPARTMENT AT [facility] Provider Note   CSN: [id number] Arrival date & time: 06/21/23  0840     History  Chief Complaint  Patient presents with   Finger Injury    Anthony Phelps is a 21 y.o. male with non contributory PMH who presents with concern for finger injury. Finger stuck in between dryer and wall, and yanked it out. Patient peeled the skin off the base of the finger and made the injury worse. Intermittently bleeding. He reports excruciatingly painful.   HPI     Home Medications Prior to Admission medications   Medication Sig Start Date End Date Taking? Authorizing Provider  cephALEXin (KEFLEX) 500 MG capsule Take 1 capsule (500 mg total) by mouth 2 (two) times daily. 06/21/23  Yes Jesenia Spera H, PA-C  oxyCODONE-acetaminophen (PERCOCET/ROXICET) 5-325 MG tablet Take 1 tablet by mouth every 6 (six) hours as needed for severe pain (pain score 7-10). 06/21/23  Yes Autumne Kallio H, PA-C  nystatin (MYCOSTATIN/NYSTOP) powder Apply topically 2 (two) times daily. 10/16/17   Caccavale, Sophia, PA-C      Allergies    Patient has no known allergies.    Review of Systems   Review of Systems  All other systems reviewed and are negative.   Physical Exam Updated Vital Signs BP 139/87 (BP Location: Right Arm)   Pulse 66   Temp 98 F (36.7 C) (Oral)   Resp 18   Ht 5\' 5"  (1.651 m)   Wt 72.6 kg   SpO2 98%   BMI 26.63 kg/m  Physical Exam Vitals and nursing note reviewed.  Constitutional:      General: He is not in acute distress.    Appearance: Normal appearance.  HENT:     Head: Normocephalic and atraumatic.  Eyes:     General:        Right eye: No discharge.        Left eye: No discharge.  Cardiovascular:     Rate and Rhythm: Normal rate and regular rhythm.  Pulmonary:     Effort: Pulmonary effort is normal. No respiratory distress.  Musculoskeletal:        General: Deformity present.     Comments: Patient with an  abnormal laceration of the left fifth digit at the base of the nail, the lunula is removed and nail root is exposed, the distal nail to seem to be slightly lifted but overall elbow seem intact.  Some bleeding noted.  Skin:    General: Skin is warm and dry.     Comments: After removal of nail there was an approximately 2 cm laceration underneath the nail bed which was bleeding, somewhat dehisced, I was able to repair the laceration with sutures and bleeding was controlled.  Neurological:     Mental Status: He is alert and oriented to person, place, and time.  Psychiatric:        Mood and Affect: Mood normal.        Behavior: Behavior normal.     ED Results / Procedures / Treatments   Labs (all labs ordered are listed, but only abnormal results are displayed) Labs Reviewed - No data to display  EKG None  Radiology DG Finger Little Left  Result Date: 06/21/2023 CLINICAL DATA:  Injury of the left fifth digit EXAM: LEFT FINGER(S) - 3 VIEW COMPARISON:  None Available. FINDINGS: Comminuted mildly displaced fracture of the distal tuft of the distal phalanx of fifth digit with soft tissue swelling. No  additional fracture or dislocation. Preserved joint spaces and bone mineralization. IMPRESSION: Comminuted mildly displaced fracture of the distal tuft of the distal phalanx of fifth digit with soft tissue swelling. Electronically Signed   By: Karen Kays M.D.   On: 06/21/2023 10:34    Procedures .Nerve Block  Date/Time: 06/21/2023 12:45 PM  Performed by: Olene Floss, PA-C Authorized by: Olene Floss, PA-C   Consent:    Consent obtained:  Verbal   Consent given by:  Patient   Risks, benefits, and alternatives were discussed: yes     Risks discussed:  Nerve damage, infection, intravenous injection, bleeding, pain, unsuccessful block and swelling   Alternatives discussed:  No treatment Universal protocol:    Procedure explained and questions answered to patient or  proxy's satisfaction: yes     Patient identity confirmed:  Verbally with patient Indications:    Indications:  Procedural anesthesia Location:    Body area:  Upper extremity   Upper extremity nerve blocked: digital nerve block 5th digit.   Laterality:  Left Pre-procedure details:    Skin preparation:  Povidone-iodine Skin anesthesia:    Skin anesthesia method:  Local infiltration Procedure details:    Block needle gauge:  25 G   Anesthetic injected:  Lidocaine 1% w/o epi   Injection procedure:  Anatomic landmarks identified Post-procedure details:    Outcome:  Anesthesia achieved   Procedure completion:  Tolerated .Nail Removal  Date/Time: 06/21/2023 12:46 PM  Performed by: Olene Floss, PA-C Authorized by: Olene Floss, PA-C   Consent:    Consent obtained:  Verbal   Consent given by:  Patient   Risks, benefits, and alternatives were discussed: yes     Risks discussed:  Permanent nail deformity, infection, pain and bleeding   Alternatives discussed:  No treatment Universal protocol:    Procedure explained and questions answered to patient or proxy's satisfaction: yes     Patient identity confirmed:  Verbally with patient Pre-procedure details:    Skin preparation:  Povidone-iodine Anesthesia:    Anesthesia method:  Nerve block (see separate note) Nail Removal:    Nail removed:  Complete   Nail bed repaired: yes     Nail bed repair material:  5-0 chromic gut   Number of sutures:  5 Post-procedure details:    Dressing:  4x4 sterile gauze   Procedure completion:  Tolerated     Medications Ordered in ED Medications  oxyCODONE-acetaminophen (PERCOCET/ROXICET) 5-325 MG per tablet 1 tablet (1 tablet Oral Given 06/21/23 0925)  Tdap (BOOSTRIX) injection 0.5 mL (0.5 mLs Intramuscular Given 06/21/23 0925)  lidocaine (PF) (XYLOCAINE) 1 % injection 10 mL (10 mLs Infiltration Given by Other 06/21/23 1155)    ED Course/ Medical Decision Making/ A&P                                  Medical Decision Making  This patient is a 22 y.o. male who presents to the ED for concern of nail injury, laceration, .   Differential diagnoses prior to evaluation: Nail avulsion, nailbed injury, fracture, dislocation, versus other  Past Medical History / Social History / Additional history: Chart reviewed. Pertinent results include: Overall noncontributory, unsure of last tetanus  Physical Exam: Physical exam performed. The pertinent findings include: General: Deformity present.     Comments: Patient with an abnormal laceration of the left fifth digit at the base of the nail, the lunula is removed  and nail root is exposed, the distal nail to seem to be slightly lifted but overall elbow seem intact.  Some bleeding noted. After removal of nail there was an approximately 2 cm laceration underneath the nail bed which was bleeding, somewhat dehisced, I was able to repair the laceration with sutures and bleeding was controlled.   Medications / Treatment: Percocet for pain, tetanus updated  Consults: I spoke with Dr. Janee Morn with hand surgery who after review of the photos in patient's chart and his clinical condition recommended removal of the nail, repair of any underlying laceration, Gelfoam bandage, and follow-up in his office,  Nailbed laceration repaired after nail removal as described above, patient tolerated without difficulty.  Successful digital block.   Disposition: After consideration of the diagnostic results and the patients response to treatment, I feel that patient is stable for discharge with plan to follow-up with hand surgery as above. placed on Keflex for antibiosis given open fracture.   emergency department workup does not suggest an emergent condition requiring admission or immediate intervention beyond what has been performed at this time. The plan is: as above. The patient is safe for discharge and has been instructed to return immediately for  worsening symptoms, change in symptoms or any other concerns.  Final Clinical Impression(s) / ED Diagnoses Final diagnoses:  Open fracture of tuft of distal phalanx of finger  Laceration of nail bed of finger, initial encounter  Crushing injury of finger, initial encounter    Rx / DC Orders ED Discharge Orders          Ordered    oxyCODONE-acetaminophen (PERCOCET/ROXICET) 5-325 MG tablet  Every 6 hours PRN        06/21/23 1243    cephALEXin (KEFLEX) 500 MG capsule  2 times daily        06/21/23 1243              Samarth Ogle, [place], PA-C 06/21/23 1255    Pricilla Loveless, MD 06/21/23 1451

## 2023-06-21 NOTE — Discharge Instructions (Addendum)
Please use Tylenol or ibuprofen for pain.  You may use 600 mg ibuprofen every 6 hours or 1000 mg of Tylenol every 6 hours.  You may choose to alternate between the 2.  This would be most effective.  Not to exceed 4 g of Tylenol within 24 hours.  Not to exceed 3200 mg ibuprofen 24 hours.  

## 2023-06-28 DIAGNOSIS — Z419 Encounter for procedure for purposes other than remedying health state, unspecified: Secondary | ICD-10-CM | POA: Diagnosis not present

## 2023-07-28 DIAGNOSIS — Z419 Encounter for procedure for purposes other than remedying health state, unspecified: Secondary | ICD-10-CM | POA: Diagnosis not present

## 2023-08-17 DIAGNOSIS — F1293 Cannabis use, unspecified with withdrawal: Secondary | ICD-10-CM | POA: Diagnosis not present

## 2023-08-28 DIAGNOSIS — Z419 Encounter for procedure for purposes other than remedying health state, unspecified: Secondary | ICD-10-CM | POA: Diagnosis not present

## 2023-09-28 DIAGNOSIS — Z419 Encounter for procedure for purposes other than remedying health state, unspecified: Secondary | ICD-10-CM | POA: Diagnosis not present

## 2023-10-26 DIAGNOSIS — Z419 Encounter for procedure for purposes other than remedying health state, unspecified: Secondary | ICD-10-CM | POA: Diagnosis not present

## 2023-10-31 DIAGNOSIS — R110 Nausea: Secondary | ICD-10-CM | POA: Diagnosis not present

## 2023-10-31 DIAGNOSIS — J069 Acute upper respiratory infection, unspecified: Secondary | ICD-10-CM | POA: Diagnosis not present

## 2023-12-07 DIAGNOSIS — Z419 Encounter for procedure for purposes other than remedying health state, unspecified: Secondary | ICD-10-CM | POA: Diagnosis not present

## 2024-01-06 DIAGNOSIS — Z419 Encounter for procedure for purposes other than remedying health state, unspecified: Secondary | ICD-10-CM | POA: Diagnosis not present

## 2024-02-06 DIAGNOSIS — Z419 Encounter for procedure for purposes other than remedying health state, unspecified: Secondary | ICD-10-CM | POA: Diagnosis not present

## 2024-03-07 DIAGNOSIS — Z419 Encounter for procedure for purposes other than remedying health state, unspecified: Secondary | ICD-10-CM | POA: Diagnosis not present

## 2024-04-07 DIAGNOSIS — Z419 Encounter for procedure for purposes other than remedying health state, unspecified: Secondary | ICD-10-CM | POA: Diagnosis not present

## 2024-05-08 DIAGNOSIS — Z419 Encounter for procedure for purposes other than remedying health state, unspecified: Secondary | ICD-10-CM | POA: Diagnosis not present

## 2024-07-12 DIAGNOSIS — R0981 Nasal congestion: Secondary | ICD-10-CM | POA: Diagnosis not present

## 2024-07-12 DIAGNOSIS — R059 Cough, unspecified: Secondary | ICD-10-CM | POA: Diagnosis not present

## 2024-08-22 DIAGNOSIS — J069 Acute upper respiratory infection, unspecified: Secondary | ICD-10-CM | POA: Diagnosis not present

## 2024-08-22 DIAGNOSIS — R0981 Nasal congestion: Secondary | ICD-10-CM | POA: Diagnosis not present
